# Patient Record
Sex: Male | Born: 2005 | Race: White | Hispanic: Refuse to answer | Marital: Single | Smoking: Never smoker
Health system: Southern US, Community
[De-identification: ages and names within clinical notes are randomized; demographics above are authoritative.]

## PROBLEM LIST (undated history)

## (undated) DIAGNOSIS — L0501 Pilonidal cyst with abscess: Secondary | ICD-10-CM

## (undated) DIAGNOSIS — G479 Sleep disorder, unspecified: Secondary | ICD-10-CM

## (undated) DIAGNOSIS — G4700 Insomnia, unspecified: Secondary | ICD-10-CM

## (undated) DIAGNOSIS — Z789 Other specified health status: Secondary | ICD-10-CM

## (undated) DIAGNOSIS — K5900 Constipation, unspecified: Secondary | ICD-10-CM

## (undated) DIAGNOSIS — F32A Depression, unspecified: Secondary | ICD-10-CM

## (undated) DIAGNOSIS — F419 Anxiety disorder, unspecified: Secondary | ICD-10-CM

## (undated) DIAGNOSIS — K8020 Calculus of gallbladder without cholecystitis without obstruction: Secondary | ICD-10-CM

## (undated) DIAGNOSIS — R259 Unspecified abnormal involuntary movements: Secondary | ICD-10-CM

## (undated) DIAGNOSIS — L0390 Cellulitis, unspecified: Secondary | ICD-10-CM

## (undated) HISTORY — PX: NO PAST SURGERIES: SHX2092

## (undated) HISTORY — DX: Other specified health status: Z78.9

---
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
## 2024-04-08 ENCOUNTER — Other Ambulatory Visit (HOSPITAL_COMMUNITY): Payer: Self-pay

## 2024-04-08 MED ORDER — TRIAMCINOLONE ACETONIDE 0.1 % EX OINT
TOPICAL_OINTMENT | CUTANEOUS | 0 refills | Status: AC
  Filled 2024-04-08: qty 30, 15d supply, fill #0

## 2024-06-06 ENCOUNTER — Other Ambulatory Visit (HOSPITAL_COMMUNITY): Payer: Self-pay

## 2024-06-06 MED ORDER — CLOTRIMAZOLE 1 % EX CREA
TOPICAL_CREAM | CUTANEOUS | 1 refills | Status: AC
  Filled 2024-06-06: qty 28, 30d supply, fill #0

## 2024-07-28 ENCOUNTER — Other Ambulatory Visit (HOSPITAL_COMMUNITY): Payer: Self-pay

## 2024-08-02 ENCOUNTER — Other Ambulatory Visit: Payer: Self-pay

## 2024-08-02 ENCOUNTER — Other Ambulatory Visit (HOSPITAL_COMMUNITY): Payer: Self-pay

## 2024-08-02 MED ORDER — MOMETASONE FUROATE 0.1 % EX OINT
TOPICAL_OINTMENT | CUTANEOUS | 1 refills | Status: AC
Start: 2024-08-02 — End: ?
  Filled 2024-08-02 (×3): qty 45, 30d supply, fill #0

## 2024-09-08 ENCOUNTER — Other Ambulatory Visit (HOSPITAL_COMMUNITY): Payer: Self-pay
